# Patient Record
Sex: Male | Born: 1935 | Race: White | Hispanic: No | Smoking: Former smoker
Health system: Southern US, Community
[De-identification: ages and names within clinical notes are randomized; demographics above are authoritative.]

## PROBLEM LIST (undated history)

## (undated) DIAGNOSIS — I219 Acute myocardial infarction, unspecified: Secondary | ICD-10-CM

## (undated) DIAGNOSIS — H9201 Otalgia, right ear: Secondary | ICD-10-CM

## (undated) DIAGNOSIS — I10 Essential (primary) hypertension: Secondary | ICD-10-CM

## (undated) DIAGNOSIS — I2510 Atherosclerotic heart disease of native coronary artery without angina pectoris: Secondary | ICD-10-CM

## (undated) DIAGNOSIS — I209 Angina pectoris, unspecified: Secondary | ICD-10-CM

## (undated) DIAGNOSIS — G2581 Restless legs syndrome: Secondary | ICD-10-CM

## (undated) DIAGNOSIS — G4730 Sleep apnea, unspecified: Secondary | ICD-10-CM

## (undated) DIAGNOSIS — I509 Heart failure, unspecified: Secondary | ICD-10-CM

## (undated) DIAGNOSIS — R0602 Shortness of breath: Secondary | ICD-10-CM

## (undated) DIAGNOSIS — Z87891 Personal history of nicotine dependence: Secondary | ICD-10-CM

## (undated) DIAGNOSIS — M1990 Unspecified osteoarthritis, unspecified site: Secondary | ICD-10-CM

## (undated) HISTORY — PX: CORONARY STENT PLACEMENT: SHX1402

## (undated) HISTORY — PX: CORONARY ANGIOPLASTY: SHX604

## (undated) HISTORY — PX: REPLACEMENT TOTAL KNEE: SUR1224

---

## 2013-07-07 ENCOUNTER — Emergency Department (HOSPITAL_COMMUNITY)
Admission: EM | Admit: 2013-07-07 | Discharge: 2013-07-07 | Disposition: A | Discharge status: Home / Self Care | Payer: Medicare Other | Attending: Emergency Medicine | Admitting: Emergency Medicine

## 2013-07-07 ENCOUNTER — Encounter (HOSPITAL_COMMUNITY): Payer: Self-pay | Admitting: Emergency Medicine

## 2013-07-07 DIAGNOSIS — Z79899 Other long term (current) drug therapy: Secondary | ICD-10-CM | POA: Insufficient documentation

## 2013-07-07 DIAGNOSIS — I10 Essential (primary) hypertension: Secondary | ICD-10-CM | POA: Insufficient documentation

## 2013-07-07 DIAGNOSIS — H9209 Otalgia, unspecified ear: Secondary | ICD-10-CM | POA: Insufficient documentation

## 2013-07-07 DIAGNOSIS — Z7982 Long term (current) use of aspirin: Secondary | ICD-10-CM | POA: Insufficient documentation

## 2013-07-07 DIAGNOSIS — H9201 Otalgia, right ear: Secondary | ICD-10-CM

## 2013-07-07 HISTORY — DX: Essential (primary) hypertension: I10

## 2013-07-07 MED ORDER — ANTIPYRINE-BENZOCAINE 5.4-1.4 % OT SOLN
3.0000 [drp] | Freq: Once | OTIC | Status: AC
Start: 2013-07-07 — End: 2013-07-07
  Administered 2013-07-07: 4 [drp] via OTIC
  Filled 2013-07-07: qty 10

## 2013-07-07 NOTE — ED Provider Notes (Signed)
Medical screening examination/treatment/procedure(s) were performed by non-physician practitioner and as supervising physician I was immediately available for consultation/collaboration.    Keison Glendinning M Cecily Lawhorne, MD 07/07/13 0521 

## 2013-07-07 NOTE — Discharge Instructions (Signed)
Ear Drops, Adult You have been diagnosed with a condition requiring you to put drops of medication into your outer ear. HOME CARE INSTRUCTIONS   Put drops in the affected ear as instructed. After putting the drops in, you will need to lay down with the affected ear facing up for ten minutes so the drops will remain in the ear canal and run down and fill the canal. Continue using eardrops for as long as directed by your health care provider.  Prior to getting up, put a cotton ball gently in your ear canal. Leave enough of the ball out so it can be easily removed. Do not attempt to push this down into the canal with a cotton-tipped swab or other instrument.  Do not irrigate or wash out your ears if you have had a perforated eardrum or mastoid surgery, or unless instructed to do so by your health care provider.  Keep appointments with your health care provider as instructed.  Finish all medications, or use for the length of time as instructed. Continue the drops even if your problem seems to be doing well after a couple days, or continue as instructed. SEEK MEDICAL CARE IF:  You become worse or develop increasing pain.  You notice any unusual drainage from your ear (particularly if the drainage stinks).  You develop hearing difficulties.  You experience a serious form of dizziness in which you feel as if the room is spinning, and you feel nauseated (vertigo).  The outside of your ear becomes red or swollen or both. This may be a sign of an allergic reaction. MAKE SURE YOU:   Understand these instructions.  Will watch your condition.  Will get help right away if you are not doing well or get worse. Document Released: 05/21/2001 Document Revised: 03/17/2013 Document Reviewed: 12/22/2012 [facility] Patient Information 2014 [employer], [place]. 3-4 drops every 2-4 hours as needed for pain Do not wear your hearing aid for the next several days  Return if you develop new symptoms  or make an  appointment with Dr. Jearld FentonByers for further evaluation

## 2013-07-07 NOTE — ED Provider Notes (Signed)
CSN: [id number]     Arrival date & time 07/07/13  0242 History   First MD Initiated Contact with Patient 07/07/13 0246     Chief Complaint  Patient presents with  . Otalgia   (Consider location/radiation/quality/duration/timing/severity/associated sxs/prior Treatment) HPI Comments: Get flu, and to see his daughter week ago.  No ear pain, until last night, when he noticed it was uncomfortable, and tender to touch.  Patient does wear hearing aids. He denies any trauma, URI, symptoms, drainage from the ear, previous history of ear infections. Patient was given Tylenol by his daughter without relief. Pain radiates slightly into the angle of the jaw.  Does not cause any nausea, diaphoresis, or shortness of breath  Patient is a 80 y.o. male presenting with ear pain. The history is provided by the patient.  Otalgia Location:  Right Behind ear:  No abnormality Quality:  Throbbing Severity:  Moderate Onset quality:  Gradual Duration:  1 day Timing:  Constant Progression:  Worsening Chronicity:  New Context: not direct blow, not elevation change, not foreign body in ear, not loud noise and no water in ear   Relieved by:  Nothing Worsened by:  Palpation Ineffective treatments:  OTC medications Associated symptoms: no congestion, no cough, no ear discharge, no fever, no headaches, no hearing loss, no neck pain, no rash, no rhinorrhea, no sore throat and no tinnitus   Risk factors: recent travel   Risk factors: no chronic ear infection     Past Medical History  Diagnosis Date  . Hypertension    Past Surgical History  Procedure Laterality Date  . Replacement total knee     No family history on file. History  Substance Use Topics  . Smoking status: Never Smoker   . Smokeless tobacco: Not on file  . Alcohol Use: Yes    Review of Systems  Constitutional: Negative for fever.  HENT: Positive for ear pain. Negative for congestion, ear discharge, hearing loss, rhinorrhea, sneezing, sore  throat and tinnitus.   Respiratory: Negative for cough.   Cardiovascular: Negative for chest pain and leg swelling.  Gastrointestinal: Negative for nausea.  Musculoskeletal: Negative for neck pain.  Skin: Negative for rash and wound.  Neurological: Negative for dizziness and headaches.  All other systems reviewed and are negative.    Allergies  Review of patient's allergies indicates no known allergies.  Home Medications   Current Outpatient Rx  Name  Route  Sig  Dispense  Refill  . aspirin EC 81 MG tablet   Oral   Take 81 mg by mouth daily.         . bumetanide (BUMEX) 1 MG tablet   Oral   Take 1 mg by mouth daily.         Marland Kitchen. CALCIUM-VITAMIN D PO   Oral   Take 1 tablet by mouth daily.         Marland Kitchen. lisinopril (PRINIVIL,ZESTRIL) 2.5 MG tablet   Oral   Take 2.5 mg by mouth daily.         . metoprolol succinate (TOPROL-XL) 100 MG 24 hr tablet   Oral   Take 100 mg by mouth daily. Take with or immediately following a meal.         . Multiple Vitamin (MULTIVITAMIN WITH MINERALS) TABS tablet   Oral   Take 1 tablet by mouth daily.         . Omega-3 Fatty Acids (FISH OIL PO)   Oral   Take 1 capsule by mouth  2 (two) times daily.         Marland Kitchen omeprazole (PRILOSEC) 20 MG capsule   Oral   Take 20 mg by mouth 2 (two) times daily before a meal.         . oxybutynin (DITROPAN) 5 MG tablet   Oral   Take 5 mg by mouth daily.         . polyethylene glycol (MIRALAX / GLYCOLAX) packet   Oral   Take 17 g by mouth once a week.         . simvastatin (ZOCOR) 80 MG tablet   Oral   Take 40 mg by mouth daily.         Marland Kitchen terazosin (HYTRIN) 2 MG capsule   Oral   Take 4 mg by mouth at bedtime.          BP 103/49  Pulse 75  Temp(Src) 98.1 F (36.7 C) (Oral)  Resp 16  SpO2 95% Physical Exam  Nursing note and vitals reviewed. Constitutional: He is oriented to person, place, and time. He appears well-developed and well-nourished. No distress.  HENT:  Head:  Normocephalic and atraumatic.  Right Ear: Tympanic membrane normal. There is tenderness. No drainage or swelling. No foreign bodies. No mastoid tenderness. Tympanic membrane is not injected, not scarred, not perforated, not erythematous, not retracted and not bulging. Tympanic membrane mobility is normal. No middle ear effusion. No hemotympanum.  Left Ear: External ear normal.  Nose: Nose normal.  Mouth/Throat: Oropharynx is clear and moist.  Tm appears normal canal minimally red, no drainage, tender to touch No mastoid tenderness  Eyes: Pupils are equal, round, and reactive to light.  Neck: Normal range of motion.  Cardiovascular: Normal rate and regular rhythm.   Pulmonary/Chest: Effort normal and breath sounds normal.  Musculoskeletal: He exhibits no edema and no tenderness.  Lymphadenopathy:    He has no cervical adenopathy.  Neurological: He is alert and oriented to person, place, and time.  Skin: Skin is warm. No rash noted.    ED Course  Procedures (including critical care time) Labs Review Labs Reviewed - No data to display Imaging Review No results found.  EKG Interpretation   None       MDM   1. Otalgia of right ear     Patient reports some relief of pain after Auralgan drops  Instructed not to wear hearing aid for several day to use the drops on a regular basis If not better or develops new symptoms  Return of make an appointment with Dr, Jearld Fenton ENT fro further evaluation     Arman Filter, NP 07/07/13 0425  Arman Filter, NP 07/07/13 [phone]

## 2013-07-07 NOTE — ED Notes (Signed)
PA at bedside.

## 2013-07-07 NOTE — ED Notes (Signed)
Pt. reports right ear ache onset this evening radiating to right jaw, denies injury/ no drainage.

## 2013-07-16 ENCOUNTER — Emergency Department (HOSPITAL_COMMUNITY): Payer: Medicare Other

## 2013-07-16 ENCOUNTER — Encounter (HOSPITAL_COMMUNITY): Payer: Self-pay | Admitting: Emergency Medicine

## 2013-07-16 ENCOUNTER — Inpatient Hospital Stay (HOSPITAL_COMMUNITY)
Admission: EM | Admit: 2013-07-16 | Discharge: 2013-07-17 | DRG: 152 | Disposition: A | Discharge status: Home / Self Care | Payer: Medicare Other | Attending: Infectious Disease | Admitting: Infectious Disease

## 2013-07-16 DIAGNOSIS — I5022 Chronic systolic (congestive) heart failure: Secondary | ICD-10-CM | POA: Diagnosis present

## 2013-07-16 DIAGNOSIS — I2510 Atherosclerotic heart disease of native coronary artery without angina pectoris: Secondary | ICD-10-CM | POA: Diagnosis present

## 2013-07-16 DIAGNOSIS — H9201 Otalgia, right ear: Secondary | ICD-10-CM

## 2013-07-16 DIAGNOSIS — I959 Hypotension, unspecified: Secondary | ICD-10-CM | POA: Diagnosis present

## 2013-07-16 DIAGNOSIS — J189 Pneumonia, unspecified organism: Secondary | ICD-10-CM | POA: Diagnosis present

## 2013-07-16 DIAGNOSIS — Z87891 Personal history of nicotine dependence: Secondary | ICD-10-CM

## 2013-07-16 DIAGNOSIS — G4733 Obstructive sleep apnea (adult) (pediatric): Secondary | ICD-10-CM | POA: Diagnosis present

## 2013-07-16 DIAGNOSIS — K0889 Other specified disorders of teeth and supporting structures: Secondary | ICD-10-CM

## 2013-07-16 DIAGNOSIS — H6690 Otitis media, unspecified, unspecified ear: Principal | ICD-10-CM | POA: Diagnosis present

## 2013-07-16 DIAGNOSIS — N400 Enlarged prostate without lower urinary tract symptoms: Secondary | ICD-10-CM

## 2013-07-16 DIAGNOSIS — I25810 Atherosclerosis of coronary artery bypass graft(s) without angina pectoris: Secondary | ICD-10-CM

## 2013-07-16 DIAGNOSIS — I252 Old myocardial infarction: Secondary | ICD-10-CM

## 2013-07-16 DIAGNOSIS — R269 Unspecified abnormalities of gait and mobility: Secondary | ICD-10-CM | POA: Diagnosis present

## 2013-07-16 DIAGNOSIS — I509 Heart failure, unspecified: Secondary | ICD-10-CM | POA: Diagnosis present

## 2013-07-16 HISTORY — DX: Personal history of nicotine dependence: Z87.891

## 2013-07-16 HISTORY — DX: Otalgia, right ear: H92.01

## 2013-07-16 HISTORY — DX: Acute myocardial infarction, unspecified: I21.9

## 2013-07-16 HISTORY — DX: Restless legs syndrome: G25.81

## 2013-07-16 HISTORY — DX: Sleep apnea, unspecified: G47.30

## 2013-07-16 HISTORY — DX: Angina pectoris, unspecified: I20.9

## 2013-07-16 HISTORY — DX: Heart failure, unspecified: I50.9

## 2013-07-16 HISTORY — DX: Unspecified osteoarthritis, unspecified site: M19.90

## 2013-07-16 HISTORY — DX: Shortness of breath: R06.02

## 2013-07-16 HISTORY — DX: Atherosclerotic heart disease of native coronary artery without angina pectoris: I25.10

## 2013-07-16 LAB — CBC
HEMATOCRIT: 37.6 % — AB (ref 39.0–52.0)
Hemoglobin: 12.9 g/dL — ABNORMAL LOW (ref 13.0–17.0)
MCH: 29.9 pg (ref 26.0–34.0)
MCHC: 34.3 g/dL (ref 30.0–36.0)
MCV: 87.2 fL (ref 78.0–100.0)
PLATELETS: 142 10*3/uL — AB (ref 150–400)
RBC: 4.31 MIL/uL (ref 4.22–5.81)
RDW: 14.9 % (ref 11.5–15.5)
WBC: 7.1 10*3/uL (ref 4.0–10.5)

## 2013-07-16 LAB — POCT I-STAT TROPONIN I: TROPONIN I, POC: 0.03 ng/mL (ref 0.00–0.08)

## 2013-07-16 LAB — PROCALCITONIN: Procalcitonin: <0.1 ng/mL

## 2013-07-16 LAB — BASIC METABOLIC PANEL
BUN: 26 mg/dL — AB (ref 6–23)
CHLORIDE: 97 meq/L (ref 96–112)
CO2: 23 mEq/L (ref 19–32)
Calcium: 9.2 mg/dL (ref 8.4–10.5)
Creatinine, Ser: 1.11 mg/dL (ref 0.50–1.35)
GFR calc non Af Amer: 59 mL/min — ABNORMAL LOW (ref >90)
GFR, EST AFRICAN AMERICAN: 68 mL/min — AB (ref >90)
Glucose, Bld: 101 mg/dL — ABNORMAL HIGH (ref 70–99)
Potassium: 4.5 mEq/L (ref 3.7–5.3)
Sodium: 136 mEq/L — ABNORMAL LOW (ref 137–147)

## 2013-07-16 LAB — LACTIC ACID, PLASMA: Lactic Acid, Venous: 1 mmol/L (ref 0.5–2.2)

## 2013-07-16 MED ORDER — DEXTROSE 5 % IV SOLN: 1.0000 g | Freq: Once | INTRAVENOUS | Status: DC

## 2013-07-16 MED ORDER — MORPHINE SULFATE 4 MG/ML IJ SOLN
4.0000 mg | INTRAMUSCULAR | Status: DC | PRN
  Administered 2013-07-16 (×2): 4 mg via INTRAVENOUS
  Filled 2013-07-16 (×2): qty 1

## 2013-07-16 MED ORDER — BUMETANIDE 1 MG PO TABS
1.0000 mg | ORAL_TABLET | Freq: Every day | ORAL | Status: DC
  Administered 2013-07-16: 1 mg via ORAL
  Filled 2013-07-16 (×2): qty 1

## 2013-07-16 MED ORDER — PANTOPRAZOLE SODIUM 40 MG PO TBEC
40.0000 mg | DELAYED_RELEASE_TABLET | Freq: Every day | ORAL | Status: DC
  Administered 2013-07-17: 40 mg via ORAL
  Filled 2013-07-16: qty 1

## 2013-07-16 MED ORDER — MORPHINE SULFATE 4 MG/ML IJ SOLN
4.0000 mg | Freq: Once | INTRAMUSCULAR | Status: AC
  Administered 2013-07-16: 4 mg via INTRAVENOUS
  Filled 2013-07-16: qty 1

## 2013-07-16 MED ORDER — ENOXAPARIN SODIUM 40 MG/0.4ML ~~LOC~~ SOLN
40.0000 mg | SUBCUTANEOUS | Status: DC
Start: 2013-07-16 — End: 2013-07-17
  Administered 2013-07-16: 40 mg via SUBCUTANEOUS
  Filled 2013-07-16 (×2): qty 0.4

## 2013-07-16 MED ORDER — ATORVASTATIN CALCIUM 40 MG PO TABS
40.0000 mg | ORAL_TABLET | Freq: Every day | ORAL | Status: DC
  Administered 2013-07-16: 40 mg via ORAL
  Filled 2013-07-16 (×2): qty 1

## 2013-07-16 MED ORDER — ASPIRIN EC 81 MG PO TBEC
81.0000 mg | DELAYED_RELEASE_TABLET | Freq: Every day | ORAL | Status: DC
  Administered 2013-07-17: 81 mg via ORAL
  Filled 2013-07-16: qty 1

## 2013-07-16 MED ORDER — AZITHROMYCIN 500 MG PO TABS
500.0000 mg | ORAL_TABLET | Freq: Every day | ORAL | Status: DC
  Administered 2013-07-16 – 2013-07-17 (×2): 500 mg via ORAL
  Filled 2013-07-16 (×2): qty 1

## 2013-07-16 MED ORDER — DEXTROSE 5 % IV SOLN: 500.0000 mg | Freq: Once | INTRAVENOUS | Status: DC

## 2013-07-16 MED ORDER — DEXTROSE 5 % IV SOLN
1.0000 g | Freq: Once | INTRAVENOUS | Status: AC
  Administered 2013-07-16: 1 g via INTRAVENOUS
  Filled 2013-07-16: qty 10

## 2013-07-16 MED ORDER — METOPROLOL SUCCINATE ER 100 MG PO TB24
100.0000 mg | ORAL_TABLET | ORAL | Status: DC
  Administered 2013-07-16: 100 mg via ORAL
  Filled 2013-07-16 (×2): qty 1

## 2013-07-16 MED ORDER — DEXTROSE 5 % IV SOLN
1.0000 g | INTRAVENOUS | Status: DC
  Filled 2013-07-16: qty 10

## 2013-07-16 MED ORDER — SODIUM CHLORIDE 0.9 % IV BOLUS (SEPSIS)
500.0000 mL | Freq: Once | INTRAVENOUS | Status: AC
  Administered 2013-07-16: 500 mL via INTRAVENOUS

## 2013-07-16 NOTE — H&P (Signed)
Date: 07/16/2013               Patient Name:  Devin Trujillo MRN: [id number]  DOB: 31-Aug-1925 Age / Sex: 80 y.o., male   PCP: No Pcp Per Patient         Medical Service: Internal Medicine Teaching Service         Attending Physician: Dr. Randall Hiss, MD    First Contact: Dr. Mikey Bussing Pager: [phone]  Second Contact: Dr. Claudette Stapler Pager: [phone]       After Hours (After 5p/  First Contact Pager: [phone]  weekends / holidays): Second Contact Pager: [phone]   Chief Complaint: Right Ear Pain  History of Present Illness: Devin Trujillo is a 80 yo white male with a PMH of Chronic systolc CHF, CAD s/p stenting x3, HTN, OSA.  He is from [place] and was in [place] visiting his daughter.  On the night of 1/27 he developed a sharp pain in his right ear, on the 28th he was evaluated at [facility] where he was diagnosed with Otalgia and instructed to use Auralgan ear drops and to refrain from using his right side hearing aid.  Per patinet's daughter this treated his pain well initially unitl this morning when it came back.  She took him to see her PCP.  At the PCP's office he was found to be hypotensive with SBP in the low 80s.  He was then taken to the ED for further evaluation.  In the ED his BP has rebounded to SBP in 110s.  His ear pain was treated with morphine. And a chest xray was obtained that suggested a possible pneumonia.  He was started on antibiotics and IMTS was asked to admit. Per the patient and his daughter he has had only a chronic cough, no sputum production, no SOB, no chest pain, no abdominal pain no diarrhea, no nausea or vomiting.  They were actually out antique shopping yesterday walking up and down numerous stairs without issue.  Meds: Current Facility-Administered Medications  Medication Dose Route Frequency Provider Last Rate Last Dose  . azithromycin (ZITHROMAX) 500 mg in dextrose 5 % 250 mL IVPB  500 mg Intravenous Once Dagmar Hait, MD       Current  Outpatient Prescriptions  Medication Sig Dispense Refill  . aspirin EC 81 MG tablet Take 81 mg by mouth daily.      . bumetanide (BUMEX) 1 MG tablet Take 1 mg by mouth daily.      Marland Kitchen CALCIUM-VITAMIN D PO Take 1 tablet by mouth daily.      Marland Kitchen lisinopril (PRINIVIL,ZESTRIL) 2.5 MG tablet Take 2.5 mg by mouth daily.      . metoprolol succinate (TOPROL-XL) 100 MG 24 hr tablet Take 100 mg by mouth daily. Take with or immediately following a meal.      . Multiple Vitamin (MULTIVITAMIN WITH MINERALS) TABS tablet Take 1 tablet by mouth daily.      . Omega-3 Fatty Acids (FISH OIL PO) Take 1 capsule by mouth 2 (two) times daily.      Marland Kitchen omeprazole (PRILOSEC) 20 MG capsule Take 20 mg by mouth 2 (two) times daily before a meal.      . oxybutynin (DITROPAN) 5 MG tablet Take 5 mg by mouth every morning.       . polyethylene glycol (MIRALAX / GLYCOLAX) packet Take 17 g by mouth once a week.      . simvastatin (ZOCOR) 80 MG tablet Take 40 mg by  mouth daily.      Marland Kitchen. terazosin (HYTRIN) 2 MG capsule Take 4 mg by mouth at bedtime.        Allergies: Allergies as of 07/16/2013  . (No Known Allergies)   Past Medical History  Diagnosis Date  . Hypertension   . CHF (congestive heart failure)   . MI (myocardial infarction)   . Coronary artery disease   . Arthritis    Past Surgical History  Procedure Laterality Date  . Replacement total knee    . Coronary stent placement    . Replacement total knee     History reviewed. No pertinent family history. History   Social History  . Marital Status: Widowed    Spouse Name: N/A    Number of Children: N/A  . Years of Education: N/A   Occupational History  . Not on file.   Social History Main Topics  . Smoking status: Former Games developermoker  . Smokeless tobacco: Not on file  . Alcohol Use: Yes  . Drug Use: No  . Sexual Activity: Not on file   Other Topics Concern  . Not on file   Social History Narrative  . No narrative on file    Review of Systems: Review  of Systems  Constitutional: Negative for fever, chills, weight loss, malaise/fatigue and diaphoresis.  HENT: Positive for ear pain. Negative for congestion, ear discharge, hearing loss (no acute hearing loss but wears hearing aids bilaterally (has not worn right one lately)), nosebleeds, sore throat and tinnitus.   Eyes: Negative for blurred vision, double vision and photophobia.  Respiratory: Positive for cough (chronic no change recently). Negative for hemoptysis, sputum production, shortness of breath and wheezing.   Cardiovascular: Positive for leg swelling (daughter reports yes 3 days ago took extra half dose of dieuritc and swelling resolved.). Negative for chest pain and palpitations.  Gastrointestinal: Negative for heartburn, nausea, vomiting, abdominal pain, diarrhea, constipation and blood in stool.  Genitourinary: Negative for dysuria, urgency, frequency and hematuria.  Musculoskeletal: Negative for falls, joint pain and myalgias.       Daughter does report some gait instability  Skin: Negative for rash.  Neurological: Negative for tingling, sensory change, speech change, focal weakness, loss of consciousness, weakness and headaches.  Psychiatric/Behavioral: The patient is not nervous/anxious.      Physical Exam: Blood pressure 115/60, pulse 88, temperature 97.7 F (36.5 C), temperature source Oral, resp. rate 21, height 5\' 4"  (1.626 m), weight 193 lb (87.544 kg), SpO2 96.00%. Physical Exam  Nursing note and vitals reviewed. Constitutional: He is oriented to person, place, and time. He appears distressed (Appered moderaly distress on entering room, recieved morhpine and became much more calm.).  HENT:  Right Ear: Tympanic membrane and ear canal normal. There is tenderness. No drainage or swelling. No foreign bodies. Tympanic membrane is not erythematous and not bulging.  Left Ear: Tympanic membrane, external ear and ear canal normal. No drainage, swelling or tenderness. No foreign  bodies. Tympanic membrane is not erythematous and not bulging.  Cardiovascular: Normal rate, regular rhythm and intact distal pulses.   No murmur heard. Pulmonary/Chest: Effort normal. No respiratory distress. He has no wheezes. He has no rales.  bronchial breath sounds over left mid region, no crackles appreciated.  Musculoskeletal: He exhibits edema (1+ to shin bilateral). He exhibits no tenderness.  Neurological: He is alert and oriented to person, place, and time.  Skin: Skin is warm and dry. He is not diaphoretic.  Psychiatric: Affect and judgment normal.  Lab results: Basic Metabolic Panel:  Recent Labs  16/10/96 1055  NA 136*  K 4.5  CL 97  CO2 23  GLUCOSE 101*  BUN 26*  CREATININE 1.11  CALCIUM 9.2   CBC:  Recent Labs  07/16/13 1055  WBC 7.1  HGB 12.9*  HCT 37.6*  MCV 87.2  PLT 142*    Imaging results:  Dg Chest 2 View  07/16/2013   CLINICAL DATA:  Hypotension, history cardiac disease  EXAM: CHEST  2 VIEW  COMPARISON:  None.  FINDINGS: Cardiac shadow is mildly enlarged. The lungs are well aerated bilaterally with diffuse interstitial changes. This is likely chronic in nature. . Some patchy infiltrative changes noted in the right mid lung. The calcified granuloma is noted in the left mid lung. No acute bony abnormality is noted.  IMPRESSION: Patchy infiltrate in the right mid lung superimposed over a likely chronic interstitial pattern.   Electronically Signed   By: Alcide Clever M.D.   On: 07/16/2013 11:35   Ct Head Wo Contrast  07/16/2013   CLINICAL DATA:  Headache and right ear region pain, progressing  EXAM: CT HEAD WITHOUT CONTRAST  TECHNIQUE: Contiguous axial images were obtained from the base of the skull through the vertex without intravenous contrast. Study was obtained within 24 hr of patient's arrival at the emergency department.  COMPARISON:  None.  FINDINGS: There is moderate diffuse atrophy. There is no demonstrable mass, hemorrhage, extra-axial fluid  collection, or midline shift. There is patchy small vessel disease in the centra semiovale bilaterally. There is evidence of a prior small lacunar infarct in the inferior right centrum semiovale. No acute infarct is apparent.  Bony calvarium appears intact. Mastoid air cells are clear. The external auditory canals appear widely patent bilaterally.  There is atherosclerotic calcification in both vertebral arteries.  IMPRESSION: Atrophy with patchy supratentorial small vessel disease. No intracranial mass, hemorrhage, or acute infarct. There is bilateral vertebral artery calcification.   Electronically Signed   By: Bretta Bang M.D.   On: 07/16/2013 11:24    Other results: EAV:[id number].  Assessment & Plan by Problem: 80 yo M who presented with recurrent right ear pain, referred from his PCP due to hypotension and chest xray in ED showed possible PNA.   Questionable Pneumonia Patient has no respiratory complaints, no fever, however did become hypotensive earlier today and was found to have a patchy infiltrate of RML on CXR.  He does not meet SIRS criteria has Tachypnea but no leukocytosis, fever, or tachycardia (however is on beta blocker). -Admit to med surg -ABx to cover CAP ceftriaxone and Azithromycin -Follow blood cultures -Lactic Acid -Procalcintonin Non ICU algorithm.  -Cautious hydration 500cc bolus. -Consider outpatient PFTs  Hypotension -Will continue Toprol Xl, Hold other home antihypertensives - Check lactic acid -Check EKG now and in AM.  Chronic Systolic Heart Failure (EF 25% per daughter on recent hospitalization) -Continue Toprol XL - Atorvastain  - Hold bumetanide, lisinopril, terazosin  Ear Pain - Exam of ear unimpressive for amount of pain.  Questionable Otitis Media - Ceftriaxone and Azithromycin for CAP coverage will cover of OM. If CAP ruled out can consider Amoxicillin. - Morphine for pain, will transition to PO pain medications  OSA -CPAP (can use home  machine)   Gait instability Not assessed at this time, daughter reports he stumbles frequently.  Will reassess tomorrow and likely get PT consult.  Diet: Regular Code Status: Full DVT PPx: Lovenox Dispo: Disposition is deferred at this time, awaiting improvement  of current medical problems. Anticipated discharge in approximately 2 day(s).   The patient does not have a current PCP (No Pcp Per Patient) and does not need an [facility] hospital follow-up appointment after discharge.  The patient does not have transportation limitations that hinder transportation to clinic appointments.  Signed: Carlynn Purl, DO 07/16/2013, 1:23 PM

## 2013-07-16 NOTE — ED Notes (Signed)
Pt seen for right ear pain Jan 28th. Pain has progressively gotten worse. Pt states he woke up with excruciating pain this morning radiating to ear, jaw, back of head. Pt went to Dr. Isidore Moosffice today for ear pain- no fever. MD noticed BP of 78/48 while sitting, repeat 82/42 HR 72. Pt's dtr states that when she is in intense pain her BP drops/almost passes out- possibly same problem for her father??

## 2013-07-16 NOTE — ED Notes (Signed)
Blood cultures drawn prior to IV anitbiotics

## 2013-07-16 NOTE — ED Provider Notes (Addendum)
CSN: [id number]     Arrival date & time 07/16/13  1011 History   First MD Initiated Contact with Patient 07/16/13 1014     Chief Complaint  Patient presents with  . Otalgia  . Hypotension   (Consider location/radiation/quality/duration/timing/severity/associated sxs/prior Treatment) HPI Comments: Seen at [facility] for recurrent R ear pain. BPs in office low.  Patient is a 80 y.o. male presenting with ear pain. The history is provided by the patient.  Otalgia Location:  Right Behind ear:  Redness Quality:  Sharp Severity:  Moderate Onset quality:  Sudden Duration:  1 day Timing:  Intermittent Progression:  Worsening Chronicity:  Recurrent Context: not direct blow, not elevation change, not foreign body in ear, not loud noise and no water in ear   Relieved by:  Nothing Worsened by:  Nothing tried Associated symptoms: no abdominal pain, no cough, no fever and no vomiting     Past Medical History  Diagnosis Date  . Hypertension   . CHF (congestive heart failure)   . MI (myocardial infarction)   . Coronary artery disease   . Arthritis    Past Surgical History  Procedure Laterality Date  . Replacement total knee    . Coronary stent placement    . Replacement total knee     History reviewed. No pertinent family history. History  Substance Use Topics  . Smoking status: Former Games developermoker  . Smokeless tobacco: Not on file  . Alcohol Use: Yes    Review of Systems  Constitutional: Negative for fever.  HENT: Positive for ear pain.   Respiratory: Negative for cough and shortness of breath.   Cardiovascular: Negative for chest pain and leg swelling.  Gastrointestinal: Negative for vomiting and abdominal pain.  All other systems reviewed and are negative.    Allergies  Review of patient's allergies indicates no known allergies.  Home Medications   Current Outpatient Rx  Name  Route  Sig  Dispense  Refill  . aspirin EC 81 MG tablet   Oral   Take 81 mg by mouth  daily.         . bumetanide (BUMEX) 1 MG tablet   Oral   Take 1 mg by mouth daily.         Marland Kitchen. CALCIUM-VITAMIN D PO   Oral   Take 1 tablet by mouth daily.         Marland Kitchen. lisinopril (PRINIVIL,ZESTRIL) 2.5 MG tablet   Oral   Take 2.5 mg by mouth daily.         . metoprolol succinate (TOPROL-XL) 100 MG 24 hr tablet   Oral   Take 100 mg by mouth daily. Take with or immediately following a meal.         . Multiple Vitamin (MULTIVITAMIN WITH MINERALS) TABS tablet   Oral   Take 1 tablet by mouth daily.         . Omega-3 Fatty Acids (FISH OIL PO)   Oral   Take 1 capsule by mouth 2 (two) times daily.         Marland Kitchen. omeprazole (PRILOSEC) 20 MG capsule   Oral   Take 20 mg by mouth 2 (two) times daily before a meal.         . oxybutynin (DITROPAN) 5 MG tablet   Oral   Take 5 mg by mouth every morning.          . polyethylene glycol (MIRALAX / GLYCOLAX) packet   Oral   Take 17 g  by mouth once a week.         . simvastatin (ZOCOR) 80 MG tablet   Oral   Take 40 mg by mouth daily.         Marland Kitchen terazosin (HYTRIN) 2 MG capsule   Oral   Take 4 mg by mouth at bedtime.          BP 120/60  Pulse 84  Temp(Src) 97.7 F (36.5 C) (Oral)  Resp 21  Ht 5\' 4"  (1.626 m)  Wt 193 lb (87.544 kg)  BMI 33.11 kg/m2  SpO2 95% Physical Exam  Constitutional: He is oriented to person, place, and time. He appears well-developed and well-nourished. No distress.  HENT:  Head: Normocephalic and atraumatic.  Right Ear: External ear normal. Tympanic membrane is injected (superior).  Left Ear: Tympanic membrane and external ear normal.  Mouth/Throat: No oropharyngeal exudate.  Eyes: EOM are normal. Pupils are equal, round, and reactive to light.  Neck: Normal range of motion. Neck supple.  Cardiovascular: Normal rate and regular rhythm.  Exam reveals no friction rub.   No murmur heard. Pulmonary/Chest: Effort normal and breath sounds normal. No respiratory distress. He has no wheezes. He  has no rales.  Abdominal: He exhibits no distension. There is no tenderness. There is no rebound.  Musculoskeletal: Normal range of motion. He exhibits no edema.  Neurological: He is alert and oriented to person, place, and time.  Skin: No rash noted. He is not diaphoretic.    ED Course  Procedures (including critical care time) Labs Review Labs Reviewed  CBC - Abnormal; Notable for the following:    Hemoglobin 12.9 (*)    HCT 37.6 (*)    Platelets 142 (*)    All other components within normal limits  BASIC METABOLIC PANEL - Abnormal; Notable for the following:    Sodium 136 (*)    Glucose, Bld 101 (*)    BUN 26 (*)    GFR calc non Af Amer 59 (*)    GFR calc Af Amer 68 (*)    All other components within normal limits  CULTURE, BLOOD (ROUTINE X 2)  CULTURE, BLOOD (ROUTINE X 2)  POCT I-STAT TROPONIN I   Imaging Review Dg Chest 2 View  07/16/2013   CLINICAL DATA:  Hypotension, history cardiac disease  EXAM: CHEST  2 VIEW  COMPARISON:  None.  FINDINGS: Cardiac shadow is mildly enlarged. The lungs are well aerated bilaterally with diffuse interstitial changes. This is likely chronic in nature. . Some patchy infiltrative changes noted in the right mid lung. The calcified granuloma is noted in the left mid lung. No acute bony abnormality is noted.  IMPRESSION: Patchy infiltrate in the right mid lung superimposed over a likely chronic interstitial pattern.   Electronically Signed   By: Alcide Clever M.D.   On: 07/16/2013 11:35   Ct Head Wo Contrast  07/16/2013   CLINICAL DATA:  Headache and right ear region pain, progressing  EXAM: CT HEAD WITHOUT CONTRAST  TECHNIQUE: Contiguous axial images were obtained from the base of the skull through the vertex without intravenous contrast. Study was obtained within 24 hr of patient's arrival at the emergency department.  COMPARISON:  None.  FINDINGS: There is moderate diffuse atrophy. There is no demonstrable mass, hemorrhage, extra-axial fluid  collection, or midline shift. There is patchy small vessel disease in the centra semiovale bilaterally. There is evidence of a prior small lacunar infarct in the inferior right centrum semiovale. No acute infarct is  apparent.  Bony calvarium appears intact. Mastoid air cells are clear. The external auditory canals appear widely patent bilaterally.  There is atherosclerotic calcification in both vertebral arteries.  IMPRESSION: Atrophy with patchy supratentorial small vessel disease. No intracranial mass, hemorrhage, or acute infarct. There is bilateral vertebral artery calcification.   Electronically Signed   By: Bretta Bang M.D.   On: 07/16/2013 11:24    EKG Interpretation   None       Date: 07/16/2013  Rate: 85  Rhythm: normal sinus rhythm  QRS Axis: normal  Intervals: QR prolonged  ST/T Wave abnormalities: normal  Conduction Disutrbances:right bundle branch block  Narrative Interpretation:   Old EKG Reviewed: none available   MDM   1. CAP (community acquired pneumonia)   2. Right ear pain    Sent here from [facility] for low BPs - had systolics in the 80s in the office. One episode of systoics in the high 70s. Took his BP meds this morning. Went to the doctor because of severe R ear pain. Seen here 10 days ago for ear pain - placed on cortisporin ear drops with relief. Ear pain began again today. Per daughter, she has a hx of passing out/low BP with severe pain, she is concerned this is going on today also. Vitals with mild dyspnea here, normal BPs. R ear with mild redness, cerumen in the ear canal. Lungs clear, abdomen benign. He denies feeling SOB, CP, productive cough. CXR shows pneumonia. CAP coverage given. Will admit.     Dagmar Hait, MD 07/16/13 1237  Dagmar Hait, MD 07/16/13 [phone]

## 2013-07-16 NOTE — Progress Notes (Signed)
Utilization Review Completed.Devin Trujillo T2/11/2013  

## 2013-07-17 ENCOUNTER — Inpatient Hospital Stay (HOSPITAL_COMMUNITY): Payer: Medicare Other

## 2013-07-17 DIAGNOSIS — I509 Heart failure, unspecified: Secondary | ICD-10-CM

## 2013-07-17 DIAGNOSIS — I959 Hypotension, unspecified: Secondary | ICD-10-CM

## 2013-07-17 DIAGNOSIS — I5022 Chronic systolic (congestive) heart failure: Secondary | ICD-10-CM

## 2013-07-17 DIAGNOSIS — H9209 Otalgia, unspecified ear: Secondary | ICD-10-CM

## 2013-07-17 DIAGNOSIS — J189 Pneumonia, unspecified organism: Secondary | ICD-10-CM

## 2013-07-17 LAB — STREP PNEUMONIAE URINARY ANTIGEN: Strep Pneumo Urinary Antigen: NEGATIVE

## 2013-07-17 LAB — CBC
HCT: 36.3 % — ABNORMAL LOW (ref 39.0–52.0)
HEMOGLOBIN: 12 g/dL — AB (ref 13.0–17.0)
MCH: 29 pg (ref 26.0–34.0)
MCHC: 33.1 g/dL (ref 30.0–36.0)
MCV: 87.7 fL (ref 78.0–100.0)
Platelets: 135 10*3/uL — ABNORMAL LOW (ref 150–400)
RBC: 4.14 MIL/uL — ABNORMAL LOW (ref 4.22–5.81)
RDW: 15.1 % (ref 11.5–15.5)
WBC: 6.8 10*3/uL (ref 4.0–10.5)

## 2013-07-17 LAB — BASIC METABOLIC PANEL
BUN: 22 mg/dL (ref 6–23)
CO2: 24 mEq/L (ref 19–32)
Calcium: 9.2 mg/dL (ref 8.4–10.5)
Chloride: 102 mEq/L (ref 96–112)
Creatinine, Ser: 1.1 mg/dL (ref 0.50–1.35)
GFR, EST AFRICAN AMERICAN: 69 mL/min — AB (ref >90)
GFR, EST NON AFRICAN AMERICAN: 59 mL/min — AB (ref >90)
Glucose, Bld: 98 mg/dL (ref 70–99)
POTASSIUM: 4.5 meq/L (ref 3.7–5.3)
SODIUM: 140 meq/L (ref 137–147)

## 2013-07-17 LAB — HIV ANTIBODY (ROUTINE TESTING W REFLEX): HIV: NONREACTIVE

## 2013-07-17 LAB — TROPONIN I: Troponin I: <0.3 ng/mL (ref <0.30)

## 2013-07-17 MED ORDER — AMOXICILLIN-POT CLAVULANATE 875-125 MG PO TABS
1.0000 | ORAL_TABLET | Freq: Two times a day (BID) | ORAL | Status: DC
  Filled 2013-07-17 (×2): qty 1

## 2013-07-17 MED ORDER — AMOXICILLIN-POT CLAVULANATE 875-125 MG PO TABS: 1.0000 | ORAL_TABLET | Freq: Two times a day (BID) | ORAL | Status: AC

## 2013-07-17 MED ORDER — FUROSEMIDE 10 MG/ML IJ SOLN
60.0000 mg | Freq: Once | INTRAMUSCULAR | Status: AC
  Administered 2013-07-17: 60 mg via INTRAVENOUS
  Filled 2013-07-17: qty 6

## 2013-07-17 NOTE — Progress Notes (Signed)
Pt is being discharged home. Daughter is at bedside and will be transporting the patient home. Pt has been provided with discharge instructions. RN went over instructions with the patient

## 2013-07-17 NOTE — Progress Notes (Signed)
Subjective: Overnight team call by daughter, concerned that needed to restart Bumex, this was restarted. Team called this AM that patient was more SOB and was given 60 IV Lasix.  Patient had good urine output.  Patient and daughter report that he was not too SOB with morning, but told the nurse that he usually has oxygen while in the hospital.  He currently feels very well and his ear pain is much improved. Objective: Vital signs in last 24 hours: Filed Vitals:   07/16/13 2104 07/17/13 0504 07/17/13 1133 07/17/13 1134  BP: 113/59 112/68 109/65 104/64  Pulse: 87 69    Temp: 98.7 F (37.1 C) 97.8 F (36.6 C)    TempSrc: Oral Oral    Resp: 20 21    Height:      Weight:  190 lb 6.4 oz (86.365 kg)    SpO2: 96% 94%     Weight change:   Intake/Output Summary (Last 24 hours) at 07/17/13 1157 Last data filed at 07/17/13 0916  Gross per 24 hour  Intake   1160 ml  Output    950 ml  Net    210 ml   General: resting in bed HEENT: EOMI, no scleral icterus Cardiac: RRR, no rubs, murmurs or gallops Pulm: clear to auscultation bilaterally, moving normal volumes of air, no crackles Abd: soft, nontender, nondistended, BS present Ext: warm and well perfused, 1+ B/L pedal edema to shins Neuro: alert and oriented X3  Lab Results: Basic Metabolic Panel:  Recent Labs Lab 07/16/13 1055 07/17/13 0420  NA 136* 140  K 4.5 4.5  CL 97 102  CO2 23 24  GLUCOSE 101* 98  BUN 26* 22  CREATININE 1.11 1.10  CALCIUM 9.2 9.2   CBC:  Recent Labs Lab 07/16/13 1055 07/17/13 0420  WBC 7.1 6.8  HGB 12.9* 12.0*  HCT 37.6* 36.3*  MCV 87.2 87.7  PLT 142* 135*   Cardiac Enzymes:  Recent Labs Lab 07/17/13 0900  TROPONINI <0.30    Micro Results: No results found for this or any previous visit (from the past 240 hour(s)). Studies/Results: Dg Chest 2 View  07/17/2013   CLINICAL DATA:  Shortness of breath  EXAM: CHEST  2 VIEW  COMPARISON:  07/16/2013  FINDINGS: Cardiac shadow is stable. The  lungs are well aerated. There again patchy infiltrates identified in the right mid lung superimposed over more chronic pattern. No new focal abnormality is seen.  IMPRESSION: No change from the prior exam.   Electronically Signed   By: Alcide Clever M.D.   On: 07/17/2013 08:38   Dg Chest 2 View  07/16/2013   CLINICAL DATA:  Hypotension, history cardiac disease  EXAM: CHEST  2 VIEW  COMPARISON:  None.  FINDINGS: Cardiac shadow is mildly enlarged. The lungs are well aerated bilaterally with diffuse interstitial changes. This is likely chronic in nature. . Some patchy infiltrative changes noted in the right mid lung. The calcified granuloma is noted in the left mid lung. No acute bony abnormality is noted.  IMPRESSION: Patchy infiltrate in the right mid lung superimposed over a likely chronic interstitial pattern.   Electronically Signed   By: Alcide Clever M.D.   On: 07/16/2013 11:35   Ct Head Wo Contrast  07/16/2013   CLINICAL DATA:  Headache and right ear region pain, progressing  EXAM: CT HEAD WITHOUT CONTRAST  TECHNIQUE: Contiguous axial images were obtained from the base of the skull through the vertex without intravenous contrast. Study was obtained within  24 hr of patient's arrival at the emergency department.  COMPARISON:  None.  FINDINGS: There is moderate diffuse atrophy. There is no demonstrable mass, hemorrhage, extra-axial fluid collection, or midline shift. There is patchy small vessel disease in the centra semiovale bilaterally. There is evidence of a prior small lacunar infarct in the inferior right centrum semiovale. No acute infarct is apparent.  Bony calvarium appears intact. Mastoid air cells are clear. The external auditory canals appear widely patent bilaterally.  There is atherosclerotic calcification in both vertebral arteries.  IMPRESSION: Atrophy with patchy supratentorial small vessel disease. No intracranial mass, hemorrhage, or acute infarct. There is bilateral vertebral artery  calcification.   Electronically Signed   By: Bretta BangWilliam  Woodruff M.D.   On: 07/16/2013 11:24   Medications: I have reviewed the patient's current medications. Scheduled Meds: . amoxicillin-clavulanate  1 tablet Oral BID  . aspirin EC  81 mg Oral Daily  . atorvastatin  40 mg Oral q1800  . bumetanide  1 mg Oral Daily  . enoxaparin (LOVENOX) injection  40 mg Subcutaneous Q24H  . metoprolol succinate  100 mg Oral Q24H  . pantoprazole  40 mg Oral Daily   Continuous Infusions:  PRN Meds:.morphine injection Assessment/Plan: Questionable Pneumonia - RML suspicious for PNA but does not fit clinical picture.  Lactic acid and procalcintonin reassuring.  Patient will be discharged on Augmentin which would treat Aspiration/CAP.  Patient and daughter given return precautions but I do not feel this is actually a PNA.  Patient encouraged to follow up with his pulmonologist in [place]. Instructed to get follow up CXR in 4-6 weeks.  Hypotension - Normotensive overnight. -Patient instructed to hold terazosin until he sees PCP. -Orthostatic vital signs negative this AM. - Stable for d/c home.  Patient not septic.  Chronic Systolic Heart Failure (EF 25% per daughter on recent hospitalization) - Not volume overloaded on exam.  Encourage to resume home medications but hold terazosin.  Patient instructed to follow up with PCP on arrival home.  Patient instructed that if EF is 25% will need repeat echo and may need to consider AICD if did not improve.  Ear Pain - Possibly due to AOM, vs TMJ vs dental infection. Physical exam not revealing to process.  Will prescribe additional 9 day course of Augmentin that will cover AOM and dental process. Patient told to follow up with dentist, daughter reports she will bring to her dentist this week.  - Patient given 1 refill of Augmentin if needed.  Gait instability - Per daughter [place] cardiac [place], will resume this on arrival home.  Dispo:  Discharge home today.  The patient does have a current PCP (No Pcp Per Patient) and [facility] not [facility] after discharge.  The patient does not have transportation limitations that hinder transportation to clinic appointments.  .Services Needed at time of discharge: Y = Yes, Blank = No PT:   OT:   RN:   Equipment:   Other:     LOS: 1 day   Carlynn PurlErik Hoffman, DO 07/17/2013, 11:57 AM

## 2013-07-17 NOTE — Progress Notes (Signed)
Patient complained of shortness of breath, and fluid buildup from CHF, O2 sat at 90-91 on room air, put oxygen support on regulated at 2lpm via nasal cannula.  Rhonci heard on lower lung fields.  Notified provider on call.  Awaiting response.

## 2013-07-17 NOTE — H&P (Signed)
  Date: 07/17/2013  Patient name: Devin Trujillo  Medical record number: [id number]  Date of birth: [birth year]   I have seen and evaluated Devin Trujillo and discussed their care with the Residency Team.   Assessment and Plan: I have seen and evaluated the patient as outlined above. I agree with the formulated Assessment and Plan as detailed in the residents' admission note, with the following changes:   Pt is comfortable today sitting in chair, with rhonchorous airway sounds (per family chronic) He does not have pain with on the tragus of the external ear. He does not have point tenderness over the TMJ. He does have dentures in place.  Patients hypotension seems to have responded nicely to holding of his diuretics, aCE terazosin (though family states he got bumex twice since admission)  I feel the call of CAP with RML is not overly compelling but we are going to be covering him with antbiotics for her presumed otitis media. He may also have dental pathology and recommended that he be seen by dentist as possible.  We will sore Augmentin and given a ten-day course with a refill.  We will check orthostatics on him because I suspect this gentleman may be at risk for profound orthostasis and wrists that I would entail including potential syncope.   We will plan on dc him to home if he remains stable on his beta blocker low dose acei, diuretic but will have him not take the terazosin further.   Randall Hissornelius N [place], [place] 2/7/201512:41 PM

## 2013-07-17 NOTE — Discharge Summary (Signed)
Name: Alwaleed Obeso MRN: [id number] DOB: 05-28-1926 80 y.o. PCP: No Pcp Per Patient  Date of Admission: 07/16/2013 10:13 AM Date of Discharge: 07/17/2013 Attending Physician: Randall Hiss, MD  Discharge Diagnosis: Active Problems:   Pneumonia Hypotension Chronic CHF Right ear pain  Discharge Medications:   Medication List    STOP taking these medications       terazosin 2 MG capsule  Commonly known as:  HYTRIN      TAKE these medications       amoxicillin-clavulanate 875-125 MG per tablet  Commonly known as:  AUGMENTIN  Take 1 tablet by mouth 2 (two) times daily.     aspirin EC 81 MG tablet  Take 81 mg by mouth daily.     bumetanide 1 MG tablet  Commonly known as:  BUMEX  Take 1 mg by mouth daily.     CALCIUM-VITAMIN D PO  Take 1 tablet by mouth daily.     FISH OIL PO  Take 1 capsule by mouth 2 (two) times daily.     lisinopril 2.5 MG tablet  Commonly known as:  PRINIVIL,ZESTRIL  Take 2.5 mg by mouth daily.     metoprolol succinate 100 MG 24 hr tablet  Commonly known as:  TOPROL-XL  Take 100 mg by mouth daily. Take with or immediately following a meal.     multivitamin with minerals Tabs tablet  Take 1 tablet by mouth daily.     omeprazole 20 MG capsule  Commonly known as:  PRILOSEC  Take 20 mg by mouth 2 (two) times daily before a meal.     oxybutynin 5 MG tablet  Commonly known as:  DITROPAN  Take 5 mg by mouth every morning.     polyethylene glycol packet  Commonly known as:  MIRALAX / GLYCOLAX  Take 17 g by mouth once a week.     simvastatin 80 MG tablet  Commonly known as:  ZOCOR  Take 40 mg by mouth daily.        Disposition and follow-up:   Mr.Elizabeth Culpepper was discharged from [facility] in Good condition.  At the hospital follow up visit please address:  1.  Reassess home BP medications (Terazosin discontinued due to hypotension).  Evaluate right ear (resolution of ear pain?).  2.  Labs / imaging needed  at time of follow-up: Chest X ray in 4-6 weeks to assess resolution of patchy infiltrate.  3.  Pending labs/ test needing follow-up:  (Blood Cultures Negative-Final Result)  Follow-up Appointments:   Discharge Instructions:     Discharge Orders   Future Orders Complete By Expires   (HEART FAILURE PATIENTS) Call MD:  Anytime you have any of the following symptoms: 1) 3 pound weight gain in 24 hours or 5 pounds in 1 week 2) shortness of breath, with or without a dry hacking cough 3) swelling in the hands, feet or stomach 4) if you have to sleep on extra pillows at night in order to breathe.  As directed    Call MD for:  difficulty breathing, headache or visual disturbances  As directed    Call MD for:  severe uncontrolled pain  As directed    Call MD for:  temperature >100.4  As directed    Diet - low sodium heart healthy  As directed    Discharge instructions  As directed    Comments:     Please fill prescription for Augmentin, take 1 pill twice a day for  9 more days (to complete 10 days total) I have given you a refill if needed.  Please follow up with your PCP [place].  Please stop taking terazosin until you see your PCP.  Please see a physician in [place] or return to the ED if your symptoms worsen.   Increase activity slowly  As directed       Consultations:    Procedures Performed:  Dg Chest 2 View  07/17/2013   CLINICAL DATA:  Shortness of breath  EXAM: CHEST  2 VIEW  COMPARISON:  07/16/2013  FINDINGS: Cardiac shadow is stable. The lungs are well aerated. There again patchy infiltrates identified in the right mid lung superimposed over more chronic pattern. No new focal abnormality is seen.  IMPRESSION: No change from the prior exam.   Electronically Signed   By: Alcide CleverMark  Lukens M.D.   On: 07/17/2013 08:38   Dg Chest 2 View  07/16/2013   CLINICAL DATA:  Hypotension, history cardiac disease  EXAM: CHEST  2 VIEW  COMPARISON:  None.  FINDINGS: Cardiac shadow is  mildly enlarged. The lungs are well aerated bilaterally with diffuse interstitial changes. This is likely chronic in nature. . Some patchy infiltrative changes noted in the right mid lung. The calcified granuloma is noted in the left mid lung. No acute bony abnormality is noted.  IMPRESSION: Patchy infiltrate in the right mid lung superimposed over a likely chronic interstitial pattern.   Electronically Signed   By: Alcide CleverMark  Lukens M.D.   On: 07/16/2013 11:35   Ct Head Wo Contrast  07/16/2013   CLINICAL DATA:  Headache and right ear region pain, progressing  EXAM: CT HEAD WITHOUT CONTRAST  TECHNIQUE: Contiguous axial images were obtained from the base of the skull through the vertex without intravenous contrast. Study was obtained within 24 hr of patient's arrival at the emergency department.  COMPARISON:  None.  FINDINGS: There is moderate diffuse atrophy. There is no demonstrable mass, hemorrhage, extra-axial fluid collection, or midline shift. There is patchy small vessel disease in the centra semiovale bilaterally. There is evidence of a prior small lacunar infarct in the inferior right centrum semiovale. No acute infarct is apparent.  Bony calvarium appears intact. Mastoid air cells are clear. The external auditory canals appear widely patent bilaterally.  There is atherosclerotic calcification in both vertebral arteries.  IMPRESSION: Atrophy with patchy supratentorial small vessel disease. No intracranial mass, hemorrhage, or acute infarct. There is bilateral vertebral artery calcification.   Electronically Signed   By: Bretta BangWilliam  Woodruff M.D.   On: 07/16/2013 11:24   Admission HPI: Allayne Gitelmanaul Branum is a 80 yo white male with a PMH of Chronic systolc CHF, CAD s/p stenting x3, HTN, OSA. He is from [place] and was in [place] visiting his daughter. On the night of 1/27 he developed a sharp pain in his right ear, on the 28th [facility] where he was diagnosed with Otalgia and instructed to use  Auralgan ear drops and to refrain from using his right side hearing aid. Per patinet's daughter this treated his pain well initially unitl this morning when it came back. She took him to see her PCP. At the PCP's office he was found to be hypotensive with SBP in the low 80s. He was then taken to the ED for further evaluation. In the ED his BP has rebounded to SBP in 110s. His ear pain was treated with morphine. And a chest xray was obtained that suggested  a possible pneumonia. He was started on antibiotics and IMTS was asked to admit.  Per the patient and his daughter he has had only a chronic cough, no sputum production, no SOB, no chest pain, no abdominal pain no diarrhea, no nausea or vomiting. They were actually out antique shopping yesterday walking up and down numerous stairs without issue.   Hospital Course by problem list: Questionable Pneumonia  - Patient initially presented to his daughter's PCP for recurrent right ear pain, he was subsequently found to be hypotensive and sent to the [facility] Emergency Department where a chest x ray was taken that was suspicious for PNA but patient denied any respiratory complaints. He was started on Ceftriaxone and Azithromycin for empiric coverage for CAP. Lactic acid and procalcintonin  Were reassuring. Patient remained afebrile and was transition the next day to Augmentin which would treat Aspiration/CAP as well as a likely acute otitis medica. Patient encouraged to follow up with his pulmonologist in [place]. Instructed to get follow up CXR in 4-6 weeks.   Hypotension  Patient's Metoprolol was continued other antihypertensives were initially held due to hypotension.  On discharge he was instructed to discontinue Terazosin until his PCP follow up.  Chronic Systolic Heart Failure (EF 25% per daughter on recent hospitalization)   Not volume overloaded on exam. Instructed patient to resume home medications but hold terazosin. Patient instructed to follow up  with PCP on arrival home. Patient instructed that if EF is 25% will need repeat echo and may need to consider AICD if did not improve.   Ear Pain  Patient's initial complaint was right ear pain for which he had presented 1 week earlier to the ED and was instructed to use cortisporin ear drops and refrain for hearing aid for several days.  Exam of his ear canal and tympanic membrane was unremarkable for degree of reported pain.  Exam of his oral cavity did not reveal any suspicious areas of a dental abscess.  His ear pain was likely though to be secondary to AOM, vs TMJ vs dental infection.  His pain improved substantially after empiric antibiotics for CAP.  He was prescribe additional 9 day course of Augmentin that will cover AOM and dental process. Patient told to follow up with dentist, daughter reports she will bring to her dentist this week. Patient given 1 refill of Augmentin if needed.   Discharge Vitals:   BP 104/64  Pulse 69  Temp(Src) 97.8 F (36.6 C) (Oral)  Resp 21  Ht 5\' 4"  (1.626 m)  Wt 190 lb 6.4 oz (86.365 kg)  BMI 32.67 kg/m2  SpO2 94%  Discharge Labs:  Results for orders placed during the hospital encounter of 07/16/13 (from the past 24 hour(s))  LACTIC ACID, PLASMA     Status: None   Collection Time    07/16/13  2:28 PM      Result Value Range   Lactic Acid, Venous 1.0  0.5 - 2.2 mmol/L  PROCALCITONIN     Status: None   Collection Time    07/16/13  2:28 PM      Result Value Range   Procalcitonin <0.10    CBC     Status: Abnormal   Collection Time    07/17/13  4:20 AM      Result Value Range   WBC 6.8  4.0 - 10.5 K/uL   RBC 4.14 (*) 4.22 - 5.81 MIL/uL   Hemoglobin 12.0 (*) 13.0 - 17.0 g/dL   HCT 16.1 (*)  39.0 - 52.0 %   MCV 87.7  78.0 - 100.0 fL   MCH 29.0  26.0 - 34.0 pg   MCHC 33.1  30.0 - 36.0 g/dL   RDW 40.9  81.1 - 91.4 %   Platelets 135 (*) 150 - 400 K/uL  BASIC METABOLIC PANEL     Status: Abnormal   Collection Time    07/17/13  4:20 AM      Result  Value Range   Sodium 140  137 - 147 mEq/L   Potassium 4.5  3.7 - 5.3 mEq/L   Chloride 102  96 - 112 mEq/L   CO2 24  19 - 32 mEq/L   Glucose, Bld 98  70 - 99 mg/dL   BUN 22  6 - 23 mg/dL   Creatinine, Ser 7.82  0.50 - 1.35 mg/dL   Calcium 9.2  8.4 - 95.6 mg/dL   GFR calc non Af Amer 59 (*) >90 mL/min   GFR calc Af Amer 69 (*) >90 mL/min  TROPONIN I     Status: None   Collection Time    07/17/13  9:00 AM      Result Value Range   Troponin I <0.30  <0.30 ng/mL  STREP PNEUMONIAE URINARY ANTIGEN     Status: None   Collection Time    07/17/13  9:12 AM      Result Value Range   Strep Pneumo Urinary Antigen NEGATIVE  NEGATIVE   Results for orders placed during the hospital encounter of 07/16/13  CULTURE, BLOOD (ROUTINE X 2)     Status: None   Collection Time    07/16/13 12:26 PM      Result Value Ref Range Status   Specimen Description BLOOD RIGHT HAND   Final   Special Requests BOTTLES DRAWN AEROBIC AND ANAEROBIC   Final   Culture  Setup Time     Final   Value: 07/16/2013 16:04     Performed at [facility]   Culture     Final   Value: NO GROWTH 5 DAYS     Performed at [facility]   Report Status 07/22/2013 FINAL   Final  CULTURE, BLOOD (ROUTINE X 2)     Status: None   Collection Time    07/16/13 12:26 PM      Result Value Ref Range Status   Specimen Description BLOOD LEFT HAND   Final   Special Requests BOTTLES DRAWN AEROBIC AND ANAEROBIC   Final   Culture  Setup Time     Final   Value: 07/16/2013 16:03     Performed at [facility]   Culture     Final   Value: NO GROWTH 5 DAYS     Performed at [facility]   Report Status 07/22/2013 FINAL   Final    Signed: Carlynn Purl, DO 07/17/2013, 12:09 PM   Time Spent on Discharge: 35 minutes Services Ordered on Discharge: None Equipment Ordered on Discharge: None

## 2013-07-18 LAB — LEGIONELLA ANTIGEN, URINE: LEGIONELLA ANTIGEN, URINE: NEGATIVE

## 2013-07-22 LAB — CULTURE, BLOOD (ROUTINE X 2)
CULTURE: NO GROWTH
Culture: NO GROWTH

## 2013-07-25 NOTE — Discharge Summary (Signed)
  Date: 07/25/2013  Patient name: Devin Trujillo  Medical record number: [id number]  Date of birth: [birth year]   This patient has been seen and the plan of care was discussed with the house staff. Please see their note for complete details. I concur with their findings with the following additions/corrections:  See prior note from day of dc. Patient doing much better. Terazosin being held. He will followup with [place]CP in [place].  Randall Hissornelius N Van Dam, MD 07/25/2013, 7:52 PM

## 2014-04-10 DEATH — deceased

## 2015-03-04 IMAGING — CT CT HEAD W/O CM
2 series · 16 of 30 positions shown, 18 images · non-contrast
Comparison: None.

CLINICAL DATA: Headache and right ear region pain, progressing

EXAM:
CT HEAD WITHOUT CONTRAST
TECHNIQUE: Contiguous axial images were obtained from the base of the skull
through the vertex without intravenous contrast. Study was obtained
within 24 hr of patient's arrival at the emergency department.

[Series 2: head w/o · axial · non-contrast · 0.49mm/px · z∈[+93,+213]mm · 8 of 32 slices shown, 10 images]
[im 4/32  brain]
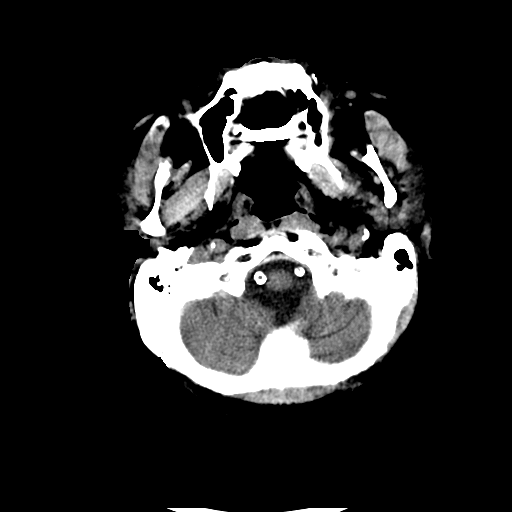
[im 4/32  bone]
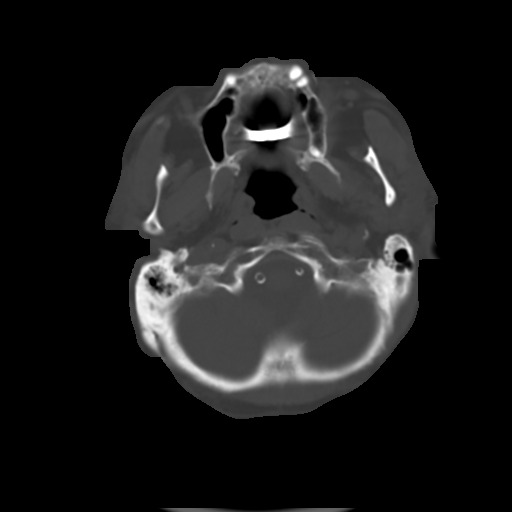
[im 7/32  brain]
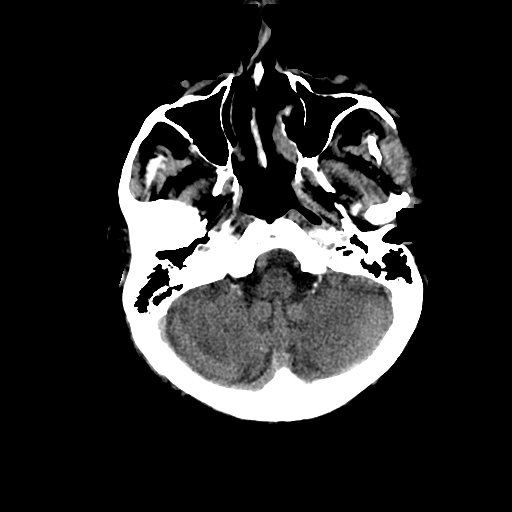
[im 11/32  brain]
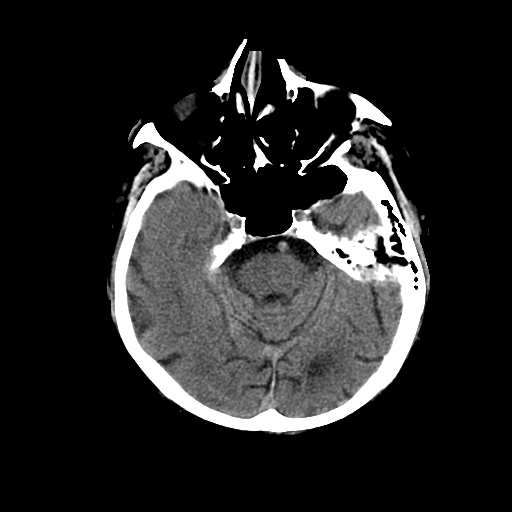
[im 14/32  brain]
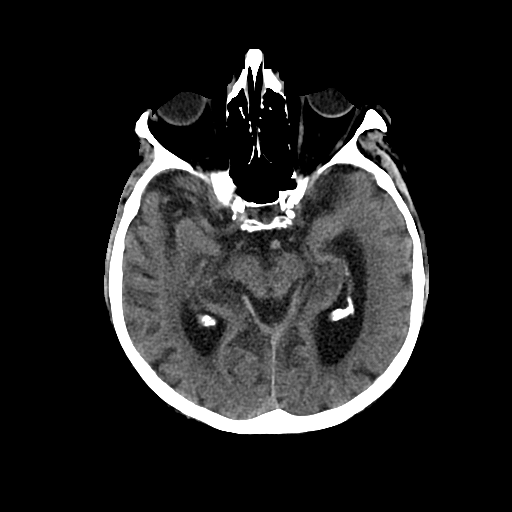
[im 18/32  brain]
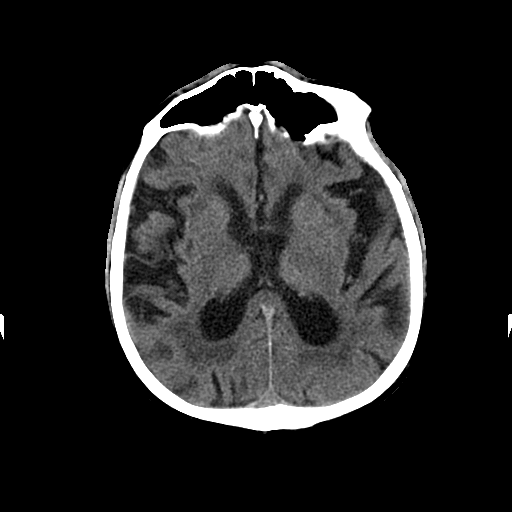
[im 18/32  bone]
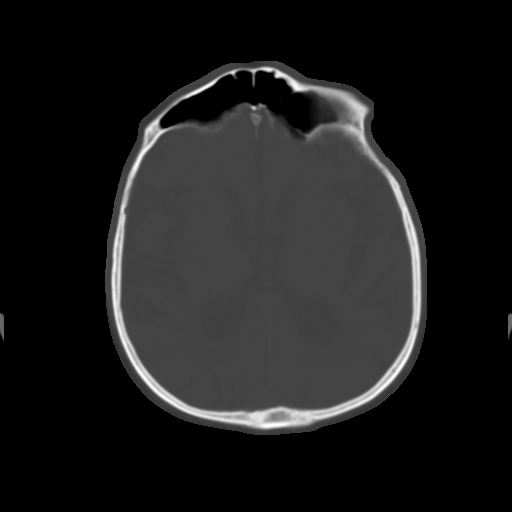
[im 21/32  brain]
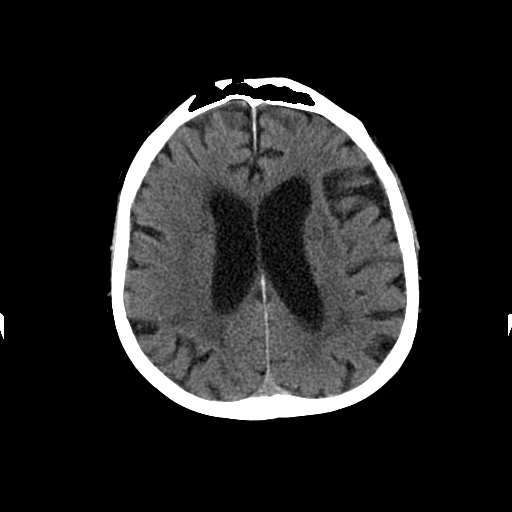
[im 25/32  brain]
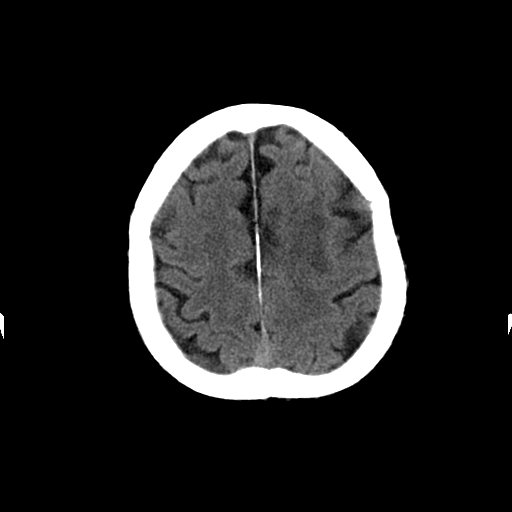
[im 28/32  brain]
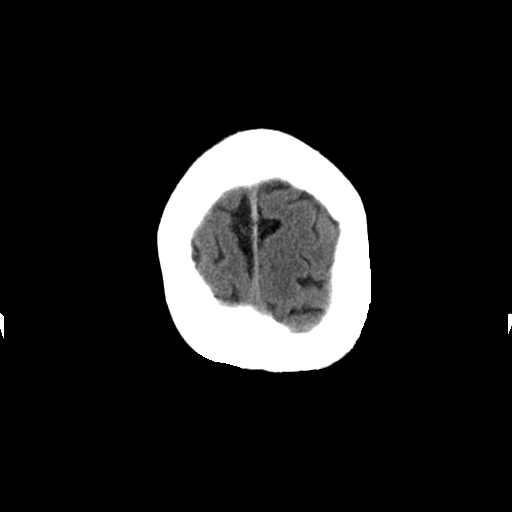

[Series 3: head w/o bone · axial · non-contrast · 0.49mm/px · z∈[+93,+216]mm · 8 of 63 slices shown]
[im 7/63  bone]
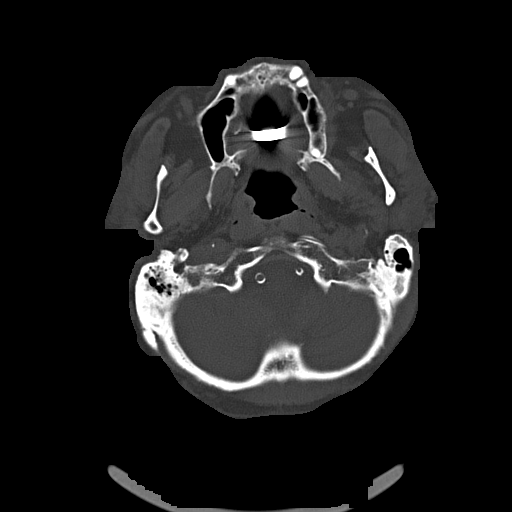
[im 14/63  bone]
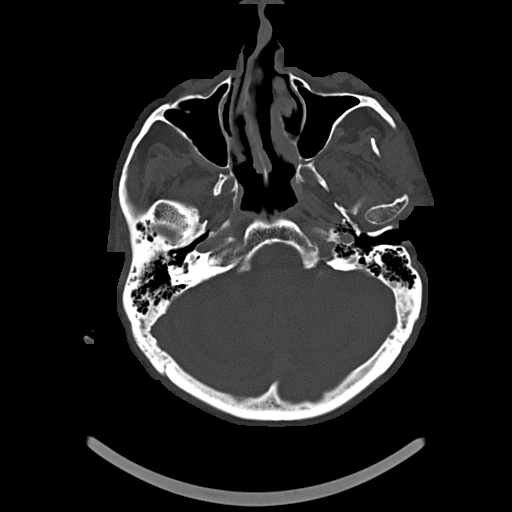
[im 20/63  bone]
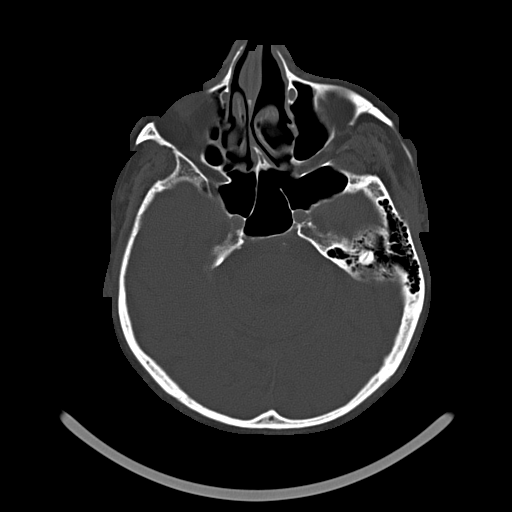
[im 27/63  bone]
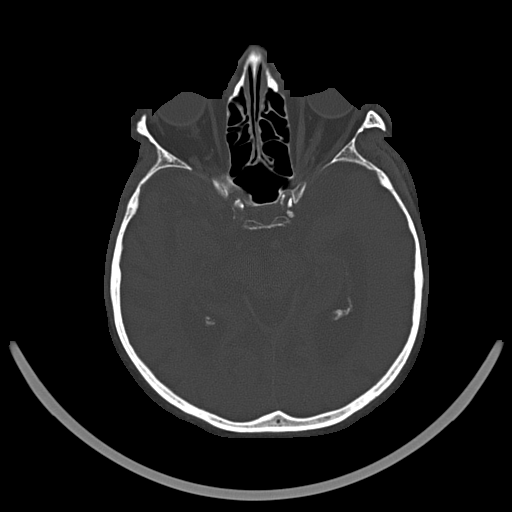
[im 36/63  bone]
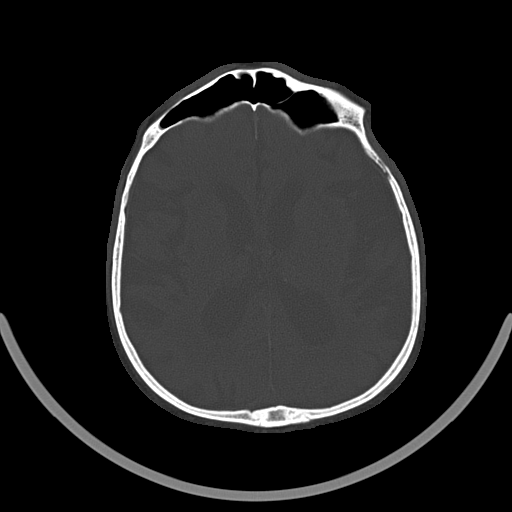
[im 43/63  bone]
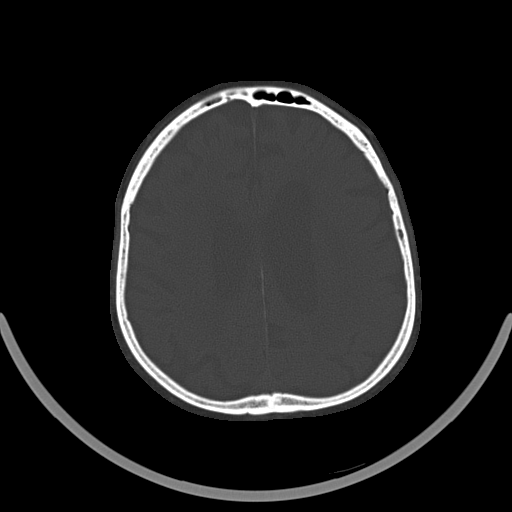
[im 49/63  bone]
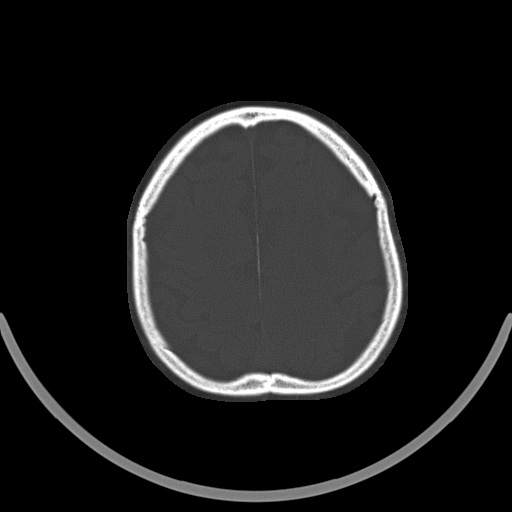
[im 56/63  bone]
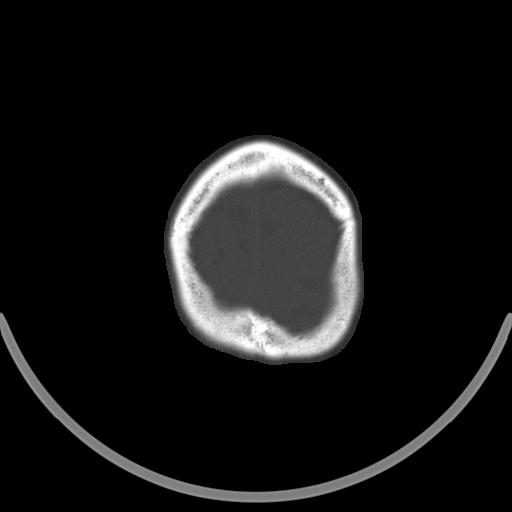

[16 of 30 positions shown; findings below may reference images not displayed]

FINDINGS: There is moderate diffuse atrophy. There is no demonstrable mass,
hemorrhage, extra-axial fluid collection, or midline shift. There is
patchy small vessel disease in the centra semiovale bilaterally.
There is evidence of a prior small lacunar infarct in the inferior
right centrum semiovale. No acute infarct is apparent.

Bony calvarium appears intact. Mastoid air cells are clear. The
external auditory canals appear widely patent bilaterally.

There is atherosclerotic calcification in both vertebral arteries.
IMPRESSION: Atrophy with patchy supratentorial small vessel disease. No
intracranial mass, hemorrhage, or acute infarct. There is bilateral
vertebral artery calcification.

## 2015-03-04 IMAGING — CR DG CHEST 2V
2 series · 2 of 2 positions shown · non-contrast
Comparison: None.

CLINICAL DATA: Hypotension, history cardiac disease

EXAM:
CHEST  2 VIEW

[w chest pa]
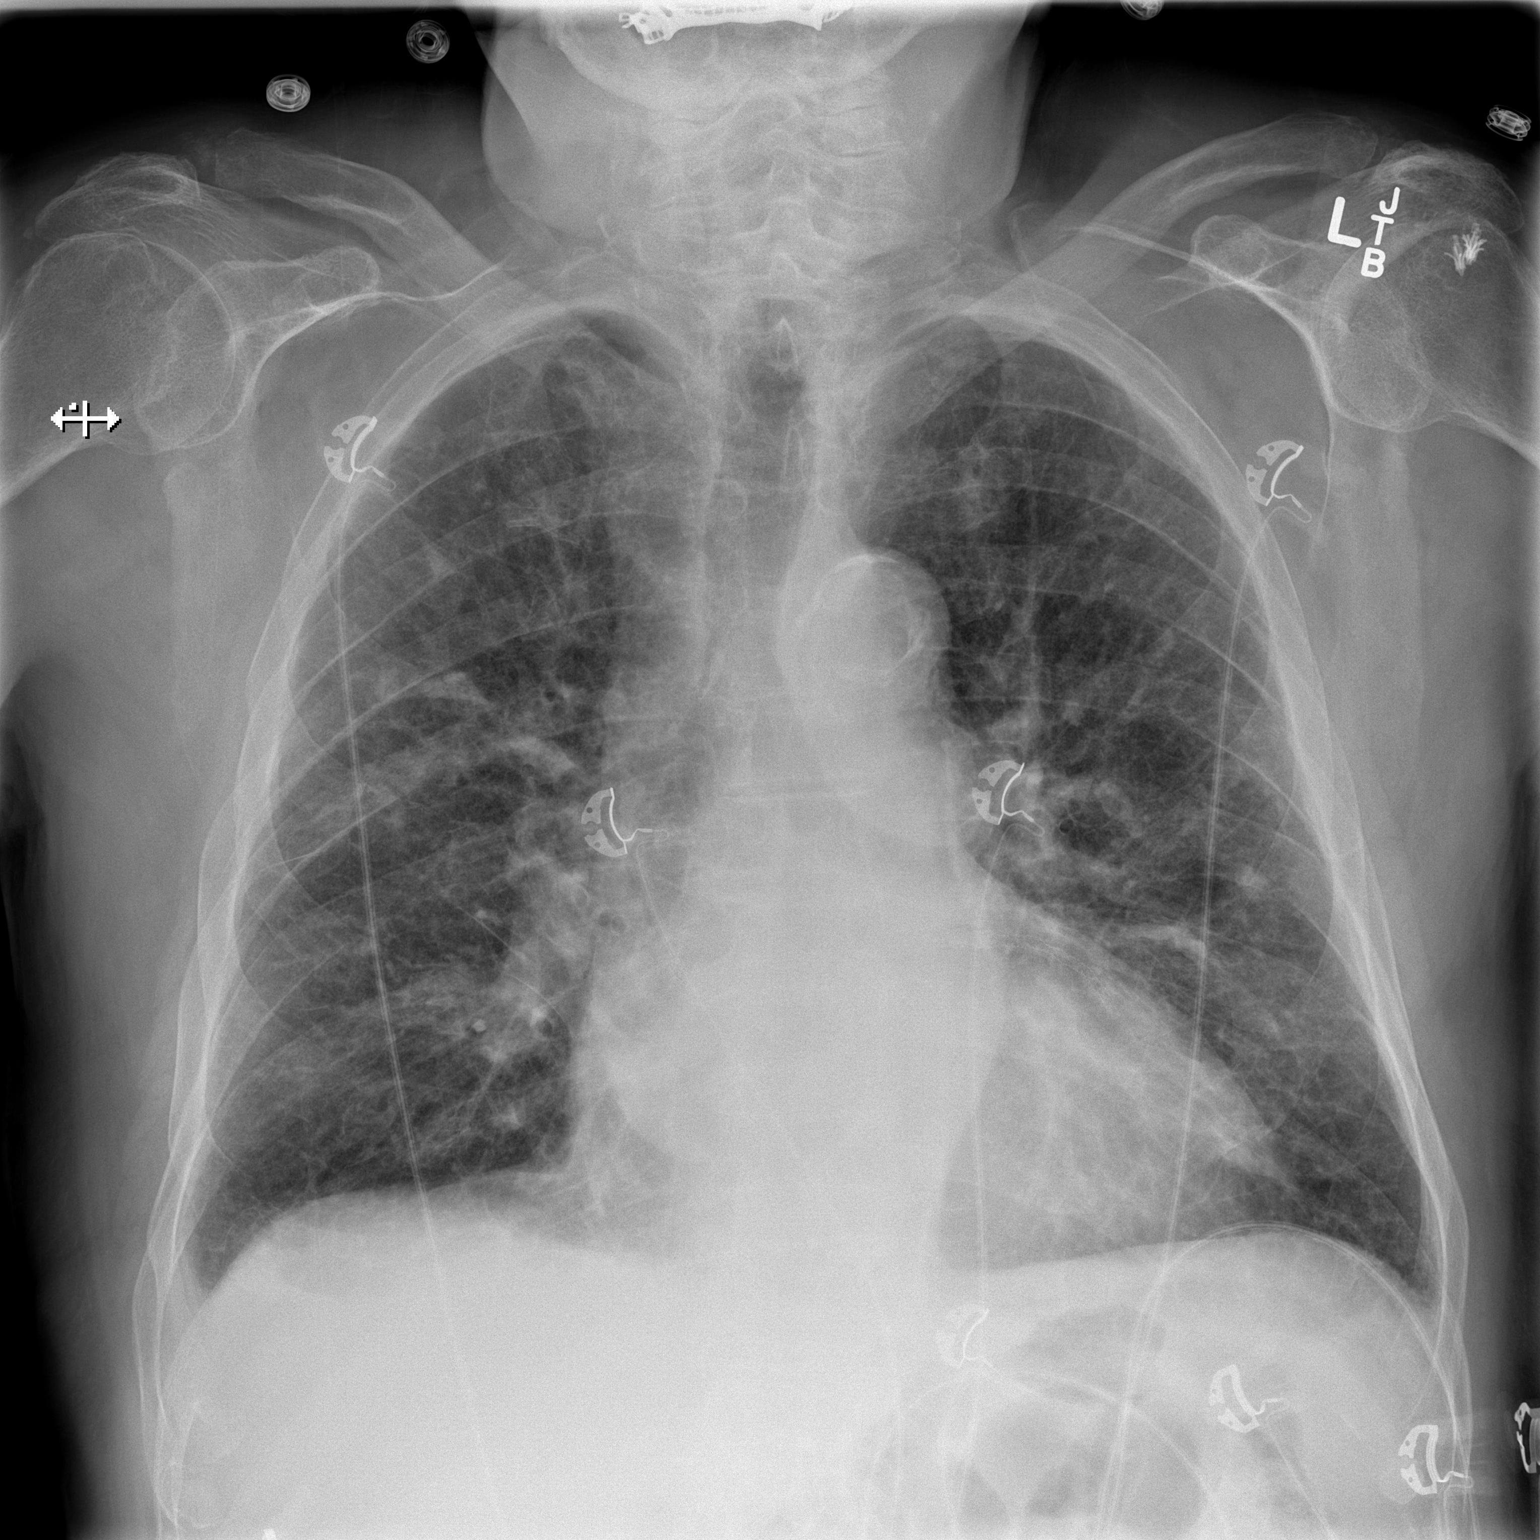

[w chest lat]
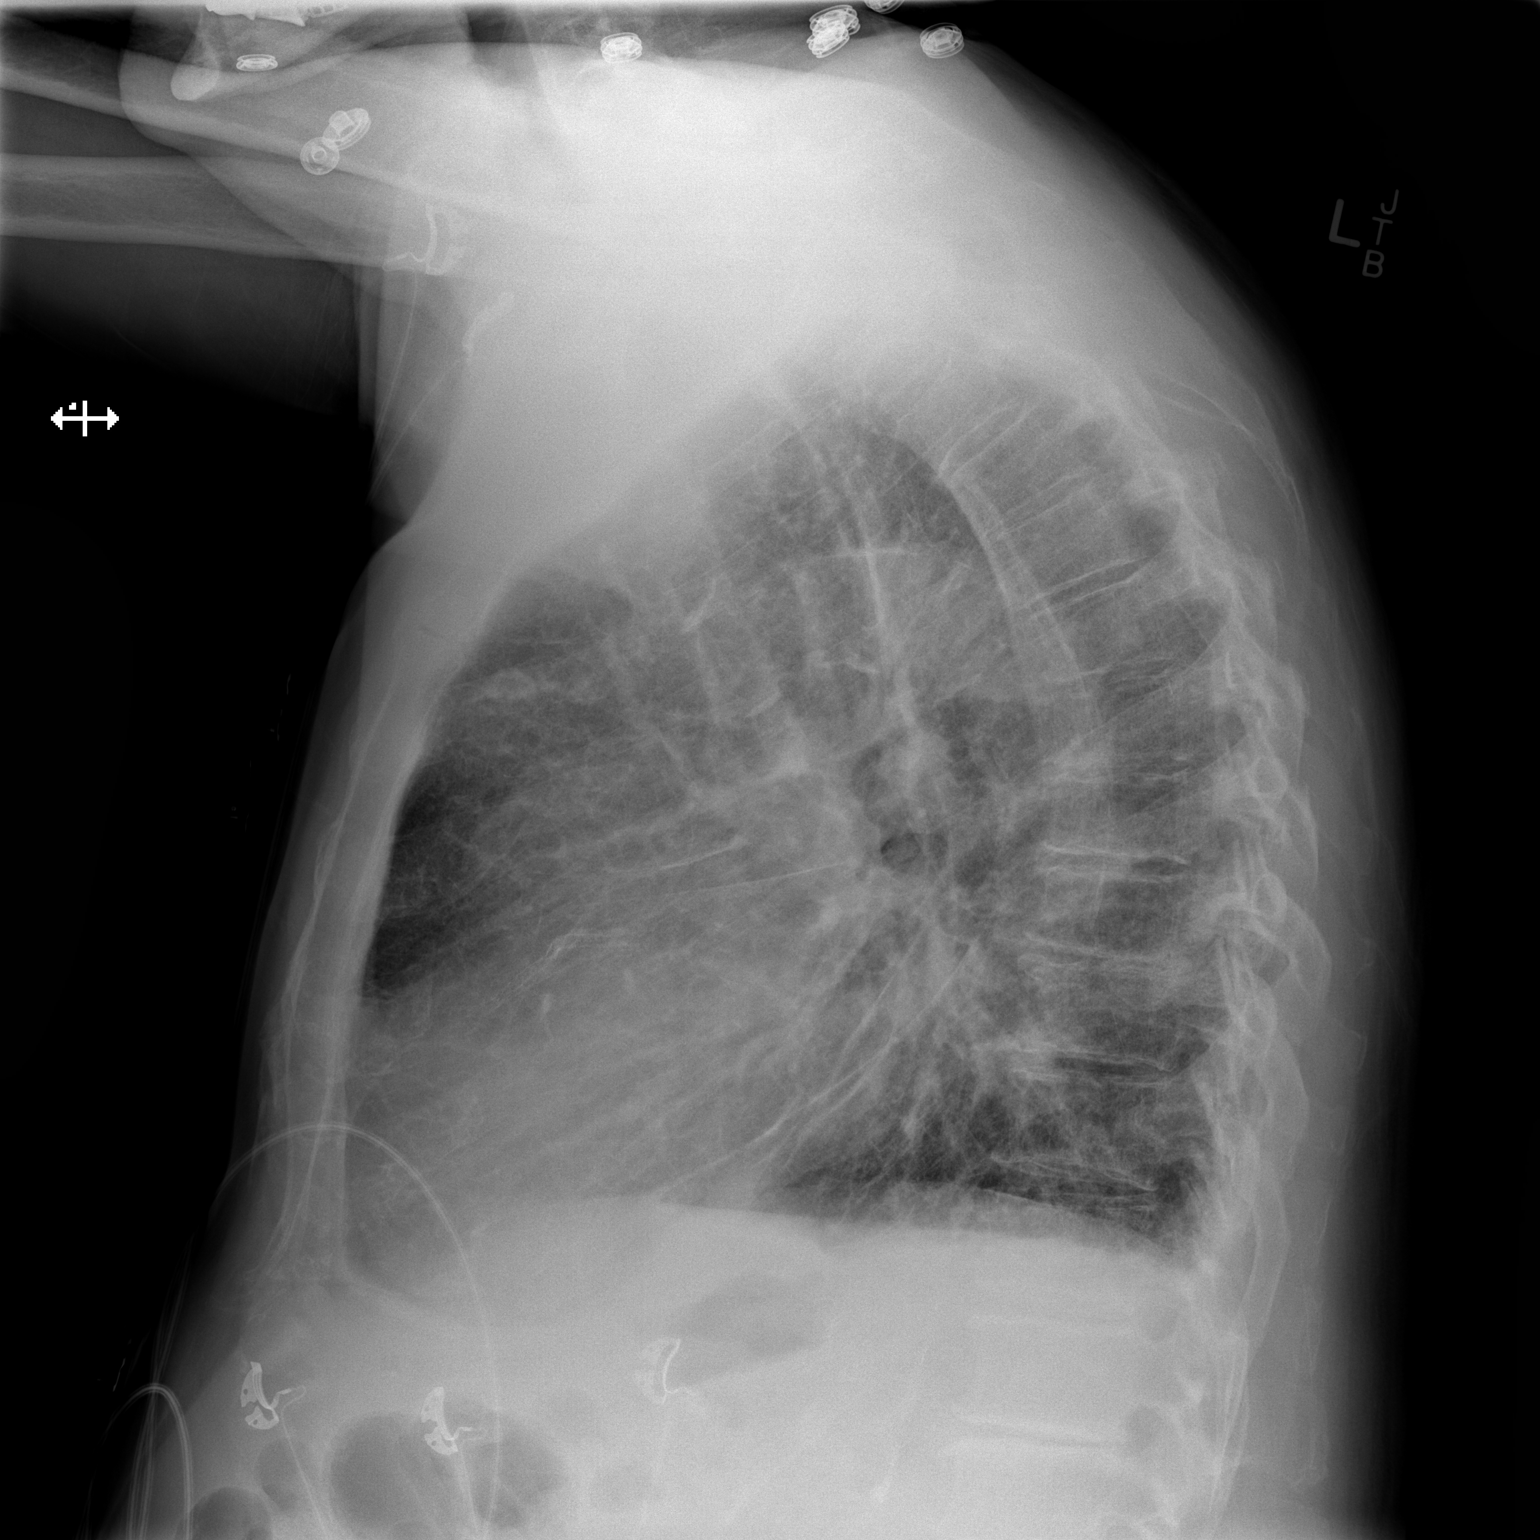

[2 of 2 positions shown; findings below may reference images not displayed]

FINDINGS: Cardiac shadow is mildly enlarged. The lungs are well aerated
bilaterally with diffuse interstitial changes. This is likely
chronic in nature. . Some patchy infiltrative changes noted in the
right mid lung. The calcified granuloma is noted in the left mid
lung. No acute bony abnormality is noted.
IMPRESSION: Patchy infiltrate in the right mid lung superimposed over a likely
chronic interstitial pattern.
